# Patient Record
Sex: Male | Born: 2005
Health system: Southern US, Community
[De-identification: ages and names within clinical notes are randomized; demographics above are authoritative.]

## PROBLEM LIST (undated history)

## (undated) HISTORY — PX: ADENOIDECTOMY: SUR15

## (undated) HISTORY — PX: TONSILLECTOMY: SUR1361

## (undated) HISTORY — PX: TYMPANOSTOMY TUBE PLACEMENT: SHX32

---
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
## 2023-07-12 ENCOUNTER — Ambulatory Visit: Payer: Self-pay | Admitting: *Deleted

## 2023-07-12 ENCOUNTER — Ambulatory Visit: Admitting: Family Medicine

## 2023-07-12 ENCOUNTER — Encounter: Payer: Self-pay | Admitting: Family Medicine

## 2023-07-12 ENCOUNTER — Other Ambulatory Visit: Payer: Self-pay | Admitting: Family Medicine

## 2023-07-12 VITALS — BP 114/72 | HR 72 | Temp 98.3°F | Ht 76.0 in | Wt 222.2 lb

## 2023-07-12 DIAGNOSIS — R310 Gross hematuria: Secondary | ICD-10-CM

## 2023-07-12 DIAGNOSIS — R300 Dysuria: Secondary | ICD-10-CM | POA: Diagnosis not present

## 2023-07-12 LAB — POCT URINALYSIS DIPSTICK
Bilirubin, UA: NEGATIVE
Glucose, UA: NEGATIVE
Ketones, UA: NEGATIVE
Nitrite, UA: NEGATIVE
Protein, UA: POSITIVE — AB
Spec Grav, UA: 1.02 (ref 1.010–1.025)
Urobilinogen, UA: 0.2 U/dL
pH, UA: 6.5 (ref 5.0–8.0)

## 2023-07-12 MED ORDER — NITROFURANTOIN MONOHYD MACRO 100 MG PO CAPS: 100.0000 mg | ORAL_CAPSULE | Freq: Two times a day (BID) | ORAL | 0 refills | Status: AC

## 2023-07-12 NOTE — Progress Notes (Signed)
 Established Patient Office Visit  Subjective   Patient ID: [name] [name], male    DOB: 20-Nov-2005  Age: 18 y.o. MRN: [id number]  Chief Complaint  Patient presents with   Establish Care   Hematuria    Happened monday, happened today as well, no burning, no itching, sharp pain when urinating     18 year old year male with no significant PMH presents for establish care and Acute concern.  C/o dark colored urine and sharp pain at the end of urination. Happened twice, first episode on Monday and 2 nd episode today.   Increased urination.  His  grand father has kidney problems. 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   Last CPE last year.  [employer] : [employer]ing civil [employer].  Has been hydrating well.      Review of Systems  All other systems reviewed and are negative.     Objective:     BP 114/72   Pulse 72   Temp 98.3 F (36.8 C)   Ht 6\' 4"  (1.93 m)   Wt 222 lb 4 oz (100.8 kg)   SpO2 97%   BMI 27.05 kg/m    Physical Exam Vitals and nursing note reviewed.  Constitutional:      Appearance: Normal appearance.  HENT:     Head: Normocephalic.     Right Ear: External ear normal.     Left Ear: External ear normal.  Eyes:     Conjunctiva/sclera: Conjunctivae normal.  Cardiovascular:     Rate and Rhythm: Normal rate.  Pulmonary:     Effort: Pulmonary effort is normal. No respiratory distress.  Abdominal:     Palpations: Abdomen is soft.  Musculoskeletal:        General: Normal range of motion.  Skin:    General: Skin is warm.  Neurological:     Mental Status: He is alert and oriented to person, place, and time.  Psychiatric:        Mood and Affect: Mood normal.      Results for orders placed or performed in visit on 07/12/23  POCT urinalysis dipstick  Result Value Ref Range   Color,  UA amber    Clarity, UA cloudy    Glucose, UA Negative Negative   Bilirubin, UA negative    Ketones, UA negative    Spec Grav, UA 1.020 1.010 - 1.025   Blood, UA LARGE (A)    pH, UA 6.5 5.0 - 8.0   Protein, UA Positive (A) Negative   Urobilinogen, UA 0.2 0.2 or 1.0 E.U./dL   Nitrite, UA NEGATIVE    Leukocytes, UA Large (3+) (A) Negative   Appearance     Odor        The ASCVD Risk score (Arnett DK, et al., 2019) failed to calculate for the following reasons:   The 2019 ASCVD risk score is only valid for ages 64 to 43    Assessment & Plan:   Problem List Items Addressed This Visit   None Visit Diagnoses       Gross hematuria    -  Primary   Relevant Orders   GC/Chlamydia Probe Amp   HIV Antibody (routine testing w rflx)   CBC with Differential   Comprehensive metabolic panel with GFR   POCT urinalysis dipstick (Completed)   Urine Culture  Dysuria       Relevant Medications   nitrofurantoin, macrocrystal-monohydrate, (MACROBID) 100 MG capsule   Other Relevant Orders   Urine Microscopic     Urine dip positive for large blood, protein, large leucocytes, negative for nitrites. Will send for Urine culture. Rx with Macrobid, will change per Urine culture results if needed. Recommend probiotics, hydration. STD screen: HIV, GC, chlamydia. Routine labs ordered.  Return in about 2 weeks (around 07/26/2023), or if symptoms worsen or fail to improve, for Annual.    Vinary K Rubyann Lingle, MD

## 2023-07-12 NOTE — Telephone Encounter (Signed)
 Noted. Ty!

## 2023-07-12 NOTE — Telephone Encounter (Signed)
 Copied from CRM [id number]. Topic: Clinical - Red Word Triage >> Jul 12, 2023 11:04 AM Ethelle Herb L wrote: Red Word that prompted transfer to Nurse Triage: blood in urine Reason for Disposition  Blood in urine  (Exception: Could be normal menstrual bleeding.)  Answer Assessment - Initial Assessment Questions 1. COLOR of URINE: "Describe the color of the urine."  (e.g., tea-colored, pink, red, bloody) "Do you have blood clots in your urine?" (e.g., none, pea, grape, small coin)     Mother, [name] [name] calling in but pt with her.    He is wanting to establish with [facility] [place].   Mother is a pt there with Dr. Rochelle Chu.      I let her know he would need to be established as a new pt. Before his acute issue of blood in his urine being addressed.   It would be up to the provider during the visit as to whether he would address the blood in urine or not during the establish new pt visit.   She verbalized understanding.   She was willing to pay for a separate OV if he could be scheduled as an established pt after being seen to establish.    The system would not allow me to schedule a separate appt because he has not be established yet.    I let Tabitha know I would put a note in asking if he could have the blood in urine addressed if possible during this visit.   She thanked me very much for doing that.    "I just don't want to have to go 2 different places, here for establishment and then to urgent care for the blood in urine".      2. ONSET: "When did the bleeding start?"      Monday he has had blood in his urine.   He only has pain burning at the end of urination 3. EPISODES: "How many times has there been blood in the urine?" or "How many times today?"     Every time 4. PAIN with URINATION: "Is there any pain with passing your urine?" If Yes, ask: "How bad is the pain?"  (Scale 1-10; or mild, moderate, severe)    - MILD: Complains slightly about urination hurting.    -  MODERATE: Interferes with normal activities.      - SEVERE: Excruciating, unwilling or unable to urinate because of the pain.      Yes 5. FEVER: "Do you have a fever?" If Yes, ask: "What is your temperature, how was it measured, and when did it start?"     No 6. ASSOCIATED SYMPTOMS: "Are you passing urine more frequently than usual?"     He did not mention any other symptoms.   He has been away at school and just got home and mentioned to his mother he is having blood in his urine. 7. OTHER SYMPTOMS: "Do you have any other symptoms?" (e.g., back/flank pain, abdomen pain, vomiting)     Just burning at the end of urination 8. PREGNANCY: "Is there any chance you are pregnant?" "When was your last menstrual period?"     N/A  Protocols used: Urine - Blood In-A-AH  Chief Complaint: Blood in urine since Monday.    Wants to establish as a new pt with this practice.  His mother is a pt there.  A new pt. Appt was made for today at 1:20 with Dr. Norma Beckers with understanding that he may  or may not address the acute issue during this appt.   Understanding was verbalized by his mother [name] [name], son was there with her during conversation. Symptoms: blood in urine since Monday.   Burning at the end of urination Frequency: Every time he urinates Pertinent Negatives: Patient denies other symptoms Disposition: [] ED /[] Urgent Care (no appt availability in office) / [x] Appointment(In office/virtual)/ []  [facility]/ [] Home Care/ [] Refused Recommended Disposition /[] [facility]/ []  Follow-up with PCP Additional Notes: New pt appt made for today at 1:20 with Dr. Norma Beckers.

## 2023-07-12 NOTE — Telephone Encounter (Signed)
 Please review. Patient is scheduled for this afternoon.

## 2023-07-14 LAB — CBC WITH DIFFERENTIAL/PLATELET
Basophils Absolute: 0.1 10*3/uL (ref 0.0–0.2)
Basos: 1 %
EOS (ABSOLUTE): 0.1 10*3/uL (ref 0.0–0.4)
Eos: 1 %
Hematocrit: 47 % (ref 37.5–51.0)
Hemoglobin: 16.1 g/dL (ref 13.0–17.7)
Immature Grans (Abs): 0 10*3/uL (ref 0.0–0.1)
Immature Granulocytes: 0 %
Lymphocytes Absolute: 2.2 10*3/uL (ref 0.7–3.1)
Lymphs: 22 %
MCH: 31.5 pg (ref 26.6–33.0)
MCHC: 34.3 g/dL (ref 31.5–35.7)
MCV: 92 fL (ref 79–97)
Monocytes Absolute: 0.6 10*3/uL (ref 0.1–0.9)
Monocytes: 6 %
Neutrophils Absolute: 7 10*3/uL (ref 1.4–7.0)
Neutrophils: 70 %
Platelets: 250 10*3/uL (ref 150–450)
RBC: 5.11 x10E6/uL (ref 4.14–5.80)
RDW: 12.1 % (ref 11.6–15.4)
WBC: 9.9 10*3/uL (ref 3.4–10.8)

## 2023-07-14 LAB — COMPREHENSIVE METABOLIC PANEL WITH GFR
ALT: 31 IU/L (ref 0–44)
AST: 22 IU/L (ref 0–40)
Albumin: 4.6 g/dL (ref 4.3–5.2)
Alkaline Phosphatase: 75 IU/L (ref 51–125)
BUN/Creatinine Ratio: 10 (ref 9–20)
BUN: 8 mg/dL (ref 6–20)
Bilirubin Total: 0.5 mg/dL (ref 0.0–1.2)
CO2: 23 mmol/L (ref 20–29)
Calcium: 9.6 mg/dL (ref 8.7–10.2)
Chloride: 99 mmol/L (ref 96–106)
Creatinine, Ser: 0.81 mg/dL (ref 0.76–1.27)
Globulin, Total: 2.7 g/dL (ref 1.5–4.5)
Glucose: 81 mg/dL (ref 70–99)
Potassium: 4.2 mmol/L (ref 3.5–5.2)
Sodium: 140 mmol/L (ref 134–144)
Total Protein: 7.3 g/dL (ref 6.0–8.5)
eGFR: 130 mL/min/{1.73_m2} (ref >59)

## 2023-07-14 LAB — HIV ANTIBODY (ROUTINE TESTING W REFLEX): HIV Screen 4th Generation wRfx: NONREACTIVE

## 2023-07-16 ENCOUNTER — Encounter: Payer: Self-pay | Admitting: Family Medicine

## 2023-07-16 LAB — URINE CULTURE

## 2023-07-16 LAB — GC/CHLAMYDIA PROBE AMP
Chlamydia trachomatis, NAA: NEGATIVE
Neisseria Gonorrhoeae by PCR: NEGATIVE

## 2023-07-16 LAB — URINALYSIS, MICROSCOPIC ONLY

## 2023-07-26 ENCOUNTER — Encounter: Payer: Self-pay | Admitting: Family Medicine

## 2023-07-26 ENCOUNTER — Ambulatory Visit (INDEPENDENT_AMBULATORY_CARE_PROVIDER_SITE_OTHER): Admitting: Family Medicine

## 2023-07-26 VITALS — BP 116/74 | HR 94 | Ht 76.0 in | Wt 225.0 lb

## 2023-07-26 DIAGNOSIS — R310 Gross hematuria: Secondary | ICD-10-CM | POA: Diagnosis not present

## 2023-07-26 DIAGNOSIS — Z0000 Encounter for general adult medical examination without abnormal findings: Secondary | ICD-10-CM

## 2023-07-26 DIAGNOSIS — Z808 Family history of malignant neoplasm of other organs or systems: Secondary | ICD-10-CM | POA: Diagnosis not present

## 2023-07-26 LAB — POCT URINALYSIS DIPSTICK
Bilirubin, UA: NEGATIVE
Blood, UA: NEGATIVE
Glucose, UA: NEGATIVE
Ketones, UA: NEGATIVE
Leukocytes, UA: NEGATIVE
Nitrite, UA: NEGATIVE
Protein, UA: NEGATIVE
Spec Grav, UA: 1.02 (ref 1.010–1.025)
Urobilinogen, UA: 0.2 U/dL
pH, UA: 6 (ref 5.0–8.0)

## 2023-07-26 NOTE — Assessment & Plan Note (Signed)
 Follows Dermatology, Does not use sunscreen, encouraged to use it, when outdoors.

## 2023-07-26 NOTE — Assessment & Plan Note (Signed)
 Resolved, Urine dip today normal. Likely in the setting of UTI, Completed antibiotics.

## 2023-07-26 NOTE — Progress Notes (Signed)
 Annual Wellness Visit     Patient: [name] [name], Male    DOB: [birth year], 18 y.o.   MRN: [id number]  Subjective  Chief Complaint  Patient presents with   Annual Exam    [name] [name] is a 18 y.o. male who presents today for his Annual Wellness Visit. He reports consuming a general diet. The patient does not participate in regular exercise at present. He generally feels well. He reports sleeping well. He does not have additional problems to discuss today.   He has no health concerns today. Overall he is doing good. Saw him in the office for gross hematuria, denies similar episodes. He urine culture grew  Staphylococcus epidermidis. Completed antibiotics.   Per mom he had some chronic ear issues for which he saw ENT and had tubes placed. He also did speech therapy.          Vision:Within last year and Dental: No current dental problems and Receives regular dental care   Family Status  Relation Name Status   Mother  Alive   Father  Alive  No partnership data on file      Medications: Outpatient Medications Prior to Visit  Medication Sig   [DISCONTINUED] flintstones complete (FLINTSTONES) 60 MG chewable tablet Chew 1 tablet by mouth daily. (Patient not taking: Reported on 07/12/2023)   No facility-administered medications prior to visit.    No Known Allergies  Patient Care Team: [name] Kimball K, MD as PCP - General (Family Medicine)  Review of Systems  All other systems reviewed and are negative.       Objective  BP 116/74   Pulse 94   Ht 6\' 4"  (1.93 m)   Wt 225 lb (102.1 kg)   SpO2 98%   BMI 27.39 kg/m    Physical Exam Vitals and nursing note reviewed.  Constitutional:      Appearance: Normal appearance.  HENT:     Head: Normocephalic.     Right Ear: External ear normal.     Left Ear: External ear normal.  Eyes:     Conjunctiva/sclera: Conjunctivae normal.  Cardiovascular:     Rate and Rhythm: Normal rate.  Pulmonary:     Effort:  Pulmonary effort is normal.  Abdominal:     Palpations: Abdomen is soft.  Musculoskeletal:        General: Normal range of motion.  Skin:    General: Skin is warm.  Neurological:     Mental Status: He is alert and oriented to person, place, and time.  Psychiatric:        Mood and Affect: Mood normal.       Most recent functional status assessment:     No data to display         Most recent fall risk assessment:    07/26/2023   10:08 AM  Fall Risk   Falls in the past year? 0  Number falls in past yr: 0  Injury with Fall? 0  Risk for fall due to : No Fall Risks  Follow up Falls evaluation completed    Most recent depression screenings:    07/26/2023   10:08 AM 07/12/2023    1:18 PM  PHQ 2/9 Scores  PHQ - 2 Score 0 0  PHQ- 9 Score 0 0   Most recent cognitive screening:     No data to display         Most recent Audit-C alcohol use screening     No data  to display         A score of 3 or more in women, and 4 or more in men indicates increased risk for alcohol abuse, EXCEPT if all of the points are from question 1   Vision/Hearing Screen: No results found.  Last CBC Lab Results  Component Value Date   WBC 9.9 07/12/2023   HGB 16.1 07/12/2023   HCT 47.0 07/12/2023   MCV 92 07/12/2023   MCH 31.5 07/12/2023   RDW 12.1 07/12/2023   PLT 250 07/12/2023   Last metabolic panel Lab Results  Component Value Date   GLUCOSE 81 07/12/2023   NA 140 07/12/2023   [name] 4.2 07/12/2023   CL 99 07/12/2023   CO2 23 07/12/2023   BUN 8 07/12/2023   CREATININE 0.81 07/12/2023   EGFR 130 07/12/2023   CALCIUM 9.6 07/12/2023   PROT 7.3 07/12/2023   ALBUMIN 4.6 07/12/2023   LABGLOB 2.7 07/12/2023   BILITOT 0.5 07/12/2023   ALKPHOS 75 07/12/2023   AST 22 07/12/2023   ALT 31 07/12/2023   ANIONGAP 7 04/05/2017   Last lipids No results found for: "CHOL", "HDL", "LDLCALC", "LDLDIRECT", "TRIG", "CHOLHDL"    Results for orders placed or performed in visit on  07/26/23  POCT urinalysis dipstick  Result Value Ref Range   Color, UA amber    Clarity, UA cloudy    Glucose, UA Negative Negative   Bilirubin, UA negative    Ketones, UA negative    Spec Grav, UA 1.020 1.010 - 1.025   Blood, UA negative    pH, UA 6.0 5.0 - 8.0   Protein, UA Negative Negative   Urobilinogen, UA 0.2 0.2 or 1.0 E.U./dL   Nitrite, UA negative    Leukocytes, UA Negative Negative   Appearance     Odor        Assessment & Plan   Annual wellness visit done today including the all of the following: Reviewed patient's Family Medical History Reviewed and updated list of patient's medical providers Assessed patient's functional ability Established a written schedule for health screening services Health Risk Assessent Completed and Reviewed  Exercise Activities and Dietary recommendations  Goals   None     Immunization History  Administered Date(s) Administered   Influenza,inj,Quad PF,6+ Mos 12/21/2017   Meningococcal B, OMV 06/21/2020, 06/24/2021    Health Maintenance  Topic Date Due   HPV VACCINES (1 - Male 3-dose series) Never done   Hepatitis C Screening  Never done   COVID-19 Vaccine (1 - 2024-25 season) Never done   DTaP/Tdap/Td (1 - Tdap) Never done   INFLUENZA VACCINE  10/05/2023   HIV Screening  Completed   Meningococcal B Vaccine  Completed     Discussed health benefits of physical activity, and encouraged him to engage in regular exercise appropriate for his age and condition.    Problem List Items Addressed This Visit       Genitourinary   Gross hematuria   Resolved, Urine dip today normal. Likely in the setting of UTI, Completed antibiotics.      Relevant Orders   POCT urinalysis dipstick (Completed)     Other   Family history of melanoma   Follows Dermatology, Does not use sunscreen, encouraged to use it, when outdoors.      Other Visit Diagnoses       Annual physical exam    -  Primary       Return in about 1 year  (around 07/25/2024) for  Annual.     [name] [name] Deshonna Trnka, MD

## 2023-09-13 DIAGNOSIS — L821 Other seborrheic keratosis: Secondary | ICD-10-CM | POA: Diagnosis not present

## 2023-09-13 DIAGNOSIS — Z872 Personal history of diseases of the skin and subcutaneous tissue: Secondary | ICD-10-CM | POA: Diagnosis not present

## 2023-09-13 DIAGNOSIS — Z7189 Other specified counseling: Secondary | ICD-10-CM | POA: Diagnosis not present

## 2023-09-13 DIAGNOSIS — D224 Melanocytic nevi of scalp and neck: Secondary | ICD-10-CM | POA: Diagnosis not present

## 2023-09-13 DIAGNOSIS — D2222 Melanocytic nevi of left ear and external auricular canal: Secondary | ICD-10-CM | POA: Diagnosis not present

## 2023-09-13 DIAGNOSIS — Z09 Encounter for follow-up examination after completed treatment for conditions other than malignant neoplasm: Secondary | ICD-10-CM | POA: Diagnosis not present

## 2023-09-13 DIAGNOSIS — D225 Melanocytic nevi of trunk: Secondary | ICD-10-CM | POA: Diagnosis not present

## 2023-09-13 DIAGNOSIS — L218 Other seborrheic dermatitis: Secondary | ICD-10-CM | POA: Diagnosis not present
